# Patient Record
Sex: Male | Born: 1955 | Race: White | Hispanic: No
Health system: Southern US, Community
[De-identification: ages and names within clinical notes are randomized; demographics above are authoritative.]

---

## 1999-02-09 ENCOUNTER — Ambulatory Visit (HOSPITAL_COMMUNITY): Admission: RE | Admit: 1999-02-09 | Discharge: 1999-02-09 | Payer: Self-pay | Admitting: Gastroenterology

## 2003-12-30 ENCOUNTER — Encounter: Admission: RE | Admit: 2003-12-30 | Discharge: 2003-12-30 | Payer: Self-pay | Admitting: Sports Medicine

## 2004-01-13 ENCOUNTER — Encounter: Admission: RE | Admit: 2004-01-13 | Discharge: 2004-01-13 | Payer: Self-pay | Admitting: Sports Medicine

## 2004-02-21 ENCOUNTER — Encounter: Admission: RE | Admit: 2004-02-21 | Discharge: 2004-02-21 | Payer: Self-pay | Admitting: Family Medicine

## 2004-03-28 ENCOUNTER — Encounter: Admission: RE | Admit: 2004-03-28 | Discharge: 2004-03-28 | Payer: Self-pay | Admitting: Family Medicine

## 2004-04-06 ENCOUNTER — Encounter: Admission: RE | Admit: 2004-04-06 | Discharge: 2004-04-06 | Payer: Self-pay | Admitting: Sports Medicine

## 2017-10-29 DIAGNOSIS — D1801 Hemangioma of skin and subcutaneous tissue: Secondary | ICD-10-CM | POA: Diagnosis not present

## 2017-10-29 DIAGNOSIS — L570 Actinic keratosis: Secondary | ICD-10-CM | POA: Diagnosis not present

## 2017-10-29 DIAGNOSIS — D485 Neoplasm of uncertain behavior of skin: Secondary | ICD-10-CM | POA: Diagnosis not present

## 2017-10-29 DIAGNOSIS — L821 Other seborrheic keratosis: Secondary | ICD-10-CM | POA: Diagnosis not present

## 2017-10-29 DIAGNOSIS — D225 Melanocytic nevi of trunk: Secondary | ICD-10-CM | POA: Diagnosis not present

## 2018-01-20 DIAGNOSIS — Z13220 Encounter for screening for lipoid disorders: Secondary | ICD-10-CM | POA: Diagnosis not present

## 2018-01-20 DIAGNOSIS — Z0000 Encounter for general adult medical examination without abnormal findings: Secondary | ICD-10-CM | POA: Diagnosis not present

## 2018-01-23 DIAGNOSIS — Z0000 Encounter for general adult medical examination without abnormal findings: Secondary | ICD-10-CM | POA: Diagnosis not present

## 2018-02-02 DIAGNOSIS — R944 Abnormal results of kidney function studies: Secondary | ICD-10-CM | POA: Diagnosis not present

## 2018-02-05 DIAGNOSIS — I519 Heart disease, unspecified: Secondary | ICD-10-CM | POA: Diagnosis not present

## 2018-02-05 DIAGNOSIS — E7849 Other hyperlipidemia: Secondary | ICD-10-CM | POA: Diagnosis not present

## 2018-02-05 DIAGNOSIS — Z8249 Family history of ischemic heart disease and other diseases of the circulatory system: Secondary | ICD-10-CM | POA: Diagnosis not present

## 2018-02-06 ENCOUNTER — Other Ambulatory Visit: Payer: Self-pay | Admitting: Cardiology

## 2018-02-06 DIAGNOSIS — Z8249 Family history of ischemic heart disease and other diseases of the circulatory system: Secondary | ICD-10-CM

## 2018-02-24 ENCOUNTER — Other Ambulatory Visit: Payer: Self-pay

## 2018-02-24 ENCOUNTER — Ambulatory Visit
Admission: RE | Admit: 2018-02-24 | Discharge: 2018-02-24 | Disposition: A | Discharge status: Home / Self Care | Payer: 59 | Source: Ambulatory Visit | Attending: Cardiology | Admitting: Cardiology

## 2018-02-24 DIAGNOSIS — Z8249 Family history of ischemic heart disease and other diseases of the circulatory system: Secondary | ICD-10-CM

## 2018-03-02 ENCOUNTER — Other Ambulatory Visit: Payer: Self-pay

## 2018-10-30 DIAGNOSIS — D225 Melanocytic nevi of trunk: Secondary | ICD-10-CM | POA: Diagnosis not present

## 2018-10-30 DIAGNOSIS — L814 Other melanin hyperpigmentation: Secondary | ICD-10-CM | POA: Diagnosis not present

## 2018-10-30 DIAGNOSIS — L570 Actinic keratosis: Secondary | ICD-10-CM | POA: Diagnosis not present

## 2018-10-30 DIAGNOSIS — L859 Epidermal thickening, unspecified: Secondary | ICD-10-CM | POA: Diagnosis not present

## 2018-10-30 DIAGNOSIS — D485 Neoplasm of uncertain behavior of skin: Secondary | ICD-10-CM | POA: Diagnosis not present

## 2018-10-30 DIAGNOSIS — L821 Other seborrheic keratosis: Secondary | ICD-10-CM | POA: Diagnosis not present

## 2018-12-31 IMAGING — CT CT HEART SCORING
4 series · 12 of 20 positions shown, 13 images · non-contrast
Comparison: None.

CLINICAL DATA: 62-year-old male with history of elevated
cholesterol. Family history of heart disease.

EXAM:
CT HEART FOR CALCIUM SCORING
TECHNIQUE: CT heart was performed on a 64 channel system using prospective ECG
gating.
A non-contrast exam for calcium scoring was performed.
Note that this exam targets the heart and the chest was not imaged
in its entirety.

[Series 2: calcium scoring 2.00 qr36 bestdiast 68% · axial · 0.41mm/px · z∈[+1741,+1829]mm · 3 of 89 slices shown, 4 images]
[im 23/89  vessel]
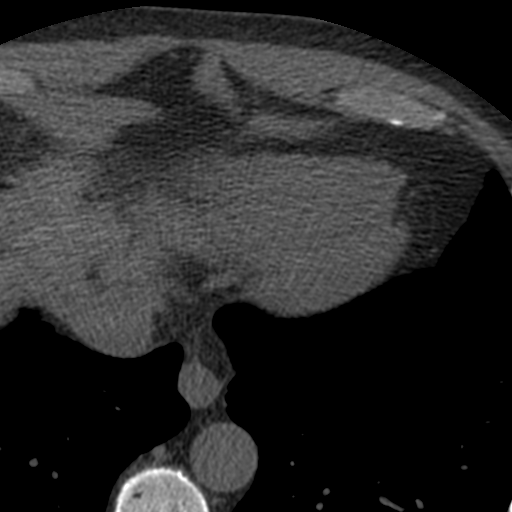
[im 23/89  lung]
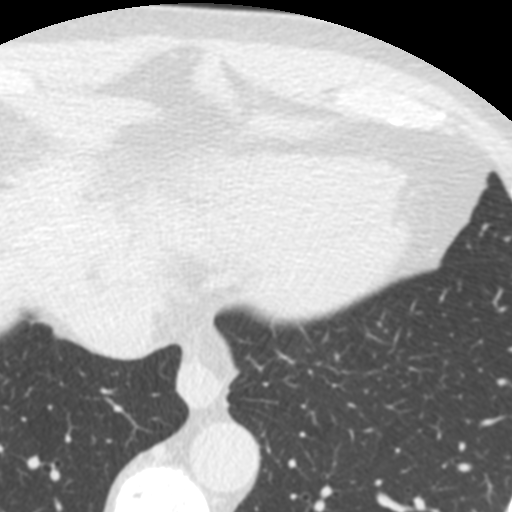
[im 45/89  vessel]
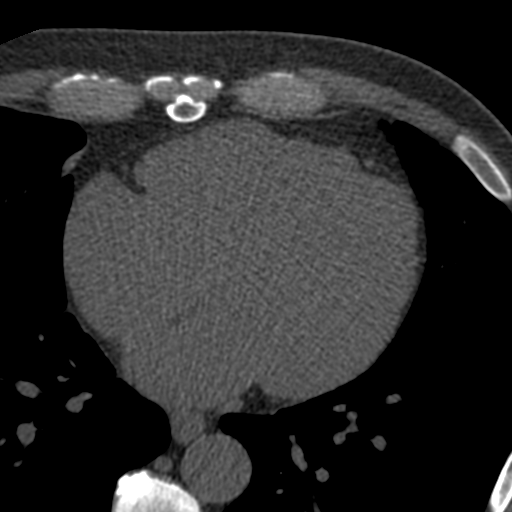
[im 67/89  vessel]
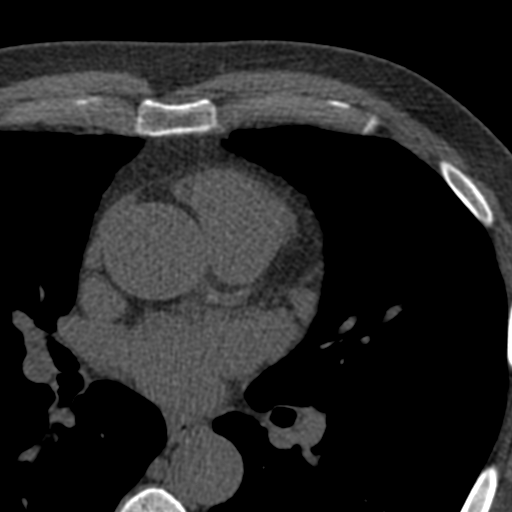

[Series 3: calcium scoring 2.00 br40 bestdiast 68% ax fov · axial · 0.41mm/px · z∈[+1741,+1829]mm · 3 of 89 slices shown (1 of 2)]
[im 23/89  vessel]
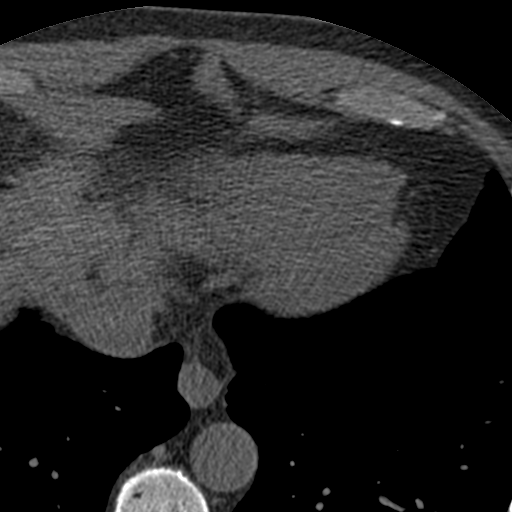
[im 45/89  vessel]
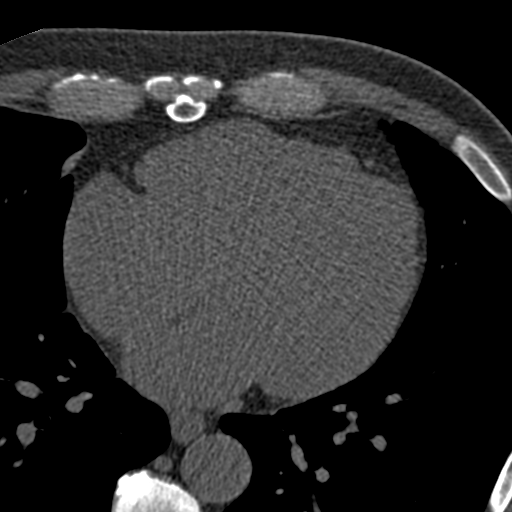
[im 67/89  vessel]
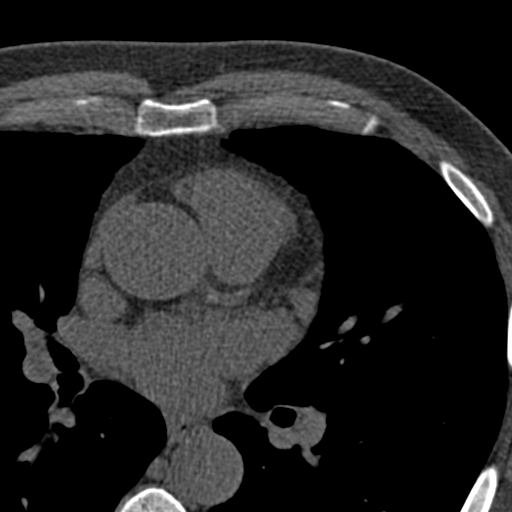

[Series 9: calcium scoring 2.00 br60 bestdiast 68% ax fov · axial · 0.65mm/px · z∈[+1739,+1827]mm · 3 of 90 slices shown]
[im 23/90  vessel]
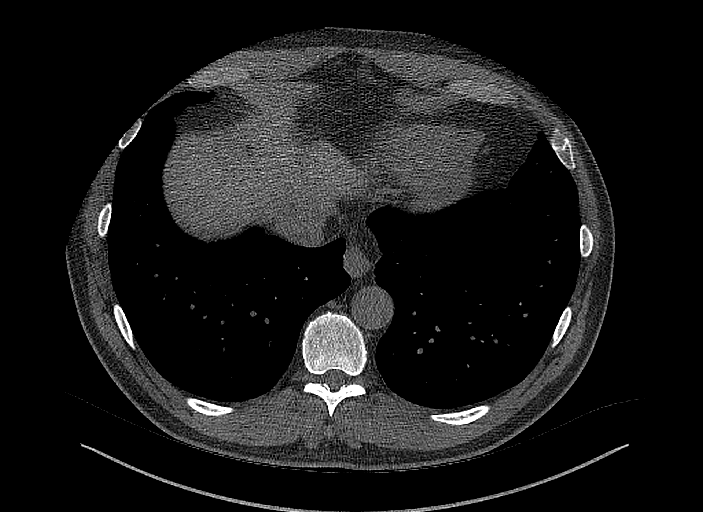
[im 45/90  vessel]
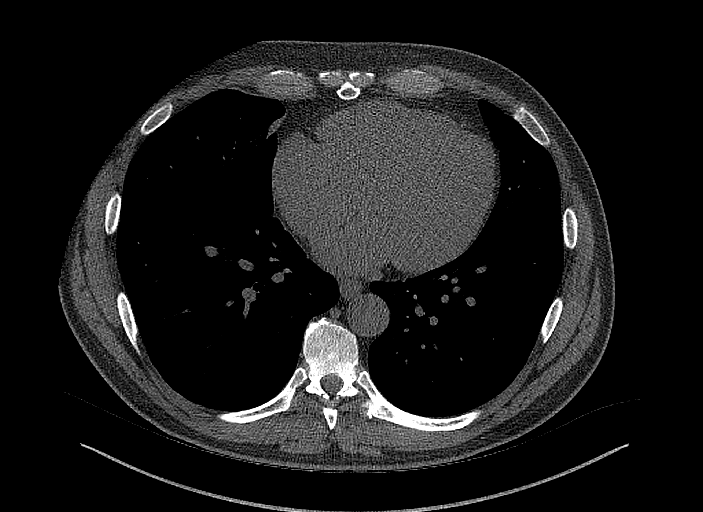
[im 67/90  vessel]
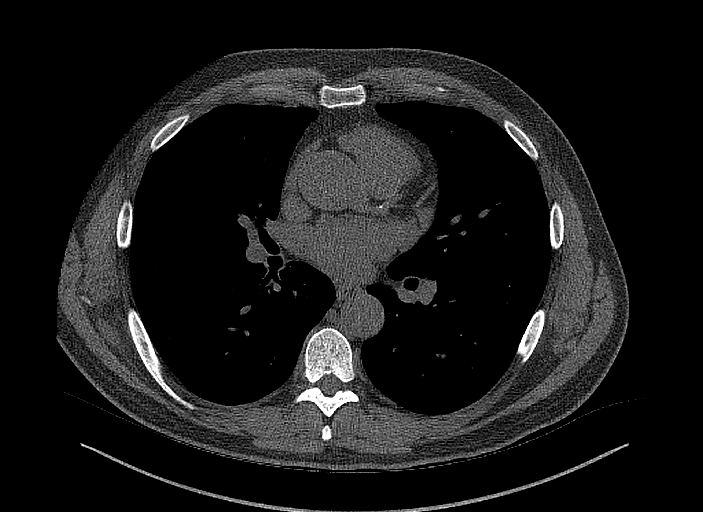

[Series 12: calcium scoring 2.00 br40 bestdiast 68% ax fov · axial · 0.63mm/px · z∈[+1741,+1829]mm · 3 of 89 slices shown (2 of 2)]
[im 23/89  vessel]
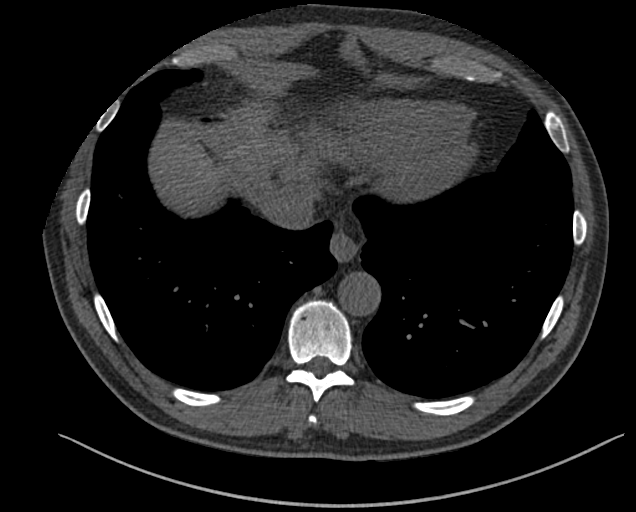
[im 45/89  vessel]
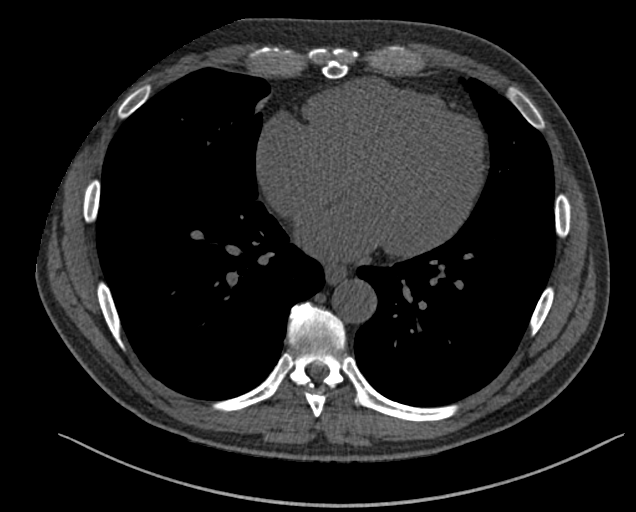
[im 67/89  vessel]
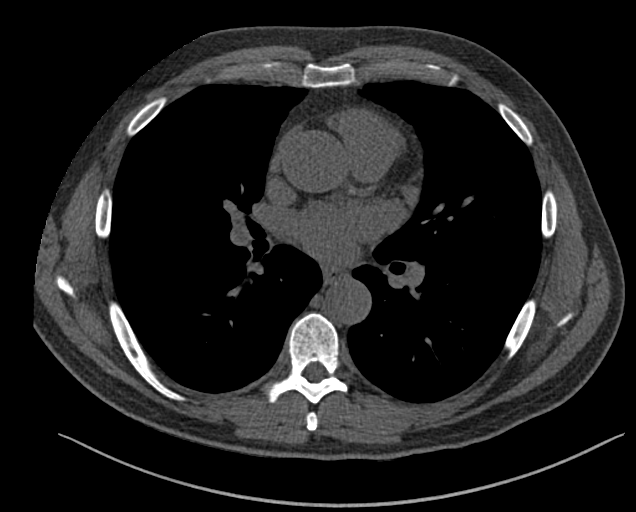

[12 of 20 positions shown; findings below may reference images not displayed]

FINDINGS: Technical quality: Good.

CORONARY CALCIUM

Total Agatston Score: 9

[HOSPITAL] percentile:  38th

OTHER FINDINGS:

2 mm right lower lobe nodule (axial image 21 of series 9). Within
the visualized portions of the thorax there are no other larger more
suspicious appearing pulmonary nodules or masses, there is no acute
consolidative airspace disease, no pleural effusions, no
pneumothorax and no lymphadenopathy. Mild scarring in the medial
segment of the right middle lobe and lingula. Visualized portions of
the upper abdomen are unremarkable. There are no aggressive
appearing lytic or blastic lesions noted in the visualized portions
of the skeleton.
IMPRESSION: 1. Patient's total coronary artery calcium score is 9 which is 38th
percentile for patient's of matched age, gender and race/ethnicity.
Please note that although the presence of coronary artery calcium
documents the presence of coronary artery disease, the severity of
this disease and any potential stenosis cannot be assessed on this
noncontrast CT examination. Assessment for potential risk factor
modification, dietary therapy or pharmacologic therapy may be
warranted, if clinically indicated.
2. 2 mm right lower lobe pulmonary nodule. This is nonspecific, but
statistically likely benign. No follow-up needed if patient is
low-risk. Non-contrast chest CT can be considered in 12 months if
patient is high-risk. This recommendation follows the consensus
statement: Guidelines for Management of Incidental Pulmonary Nodules
Detected on CT Images: From the [HOSPITAL] 6385; Radiology

## 2019-02-23 DIAGNOSIS — R944 Abnormal results of kidney function studies: Secondary | ICD-10-CM | POA: Diagnosis not present

## 2019-02-23 DIAGNOSIS — Z0000 Encounter for general adult medical examination without abnormal findings: Secondary | ICD-10-CM | POA: Diagnosis not present

## 2019-02-24 DIAGNOSIS — Z0000 Encounter for general adult medical examination without abnormal findings: Secondary | ICD-10-CM | POA: Diagnosis not present

## 2019-11-05 ENCOUNTER — Other Ambulatory Visit: Payer: Self-pay

## 2019-11-05 ENCOUNTER — Ambulatory Visit (INDEPENDENT_AMBULATORY_CARE_PROVIDER_SITE_OTHER): Payer: 59 | Admitting: Family Medicine

## 2019-11-05 ENCOUNTER — Encounter: Payer: Self-pay | Admitting: Family Medicine

## 2019-11-05 DIAGNOSIS — M25562 Pain in left knee: Secondary | ICD-10-CM

## 2019-11-05 DIAGNOSIS — M545 Low back pain, unspecified: Secondary | ICD-10-CM

## 2019-11-05 MED ORDER — TRAMADOL HCL 50 MG PO TABS: 50.0000 mg | ORAL_TABLET | Freq: Four times a day (QID) | ORAL | 0 refills | Status: AC | PRN

## 2019-11-05 MED ORDER — PREDNISONE 10 MG PO TABS: ORAL_TABLET | ORAL | 0 refills | Status: AC

## 2019-11-05 MED ORDER — TIZANIDINE HCL 2 MG PO TABS: 2.0000 mg | ORAL_TABLET | Freq: Four times a day (QID) | ORAL | 1 refills | Status: DC | PRN

## 2019-11-05 NOTE — Patient Instructions (Signed)
   Try McKenzie Protocol.  Will order x-rays and MRI if fails to improve.

## 2019-11-05 NOTE — Progress Notes (Signed)
   Office Visit Note   Patient: Alexander Brown           Date of Birth: [birth year]           MRN: [id number] Visit Date: 11/05/2019 Requested by: Lawerance Cruel, [place],  [place] [id number] PCP: Lawerance Cruel, MD  Subjective: Chief Complaint  Patient presents with  . Left Knee - Pain    "Flash-like" pain anterior knee x 2-3 days.  . Lower Back - Pain    Left lower back pain x 2-3 days. NKI. Difficulty sleeping due to pain.     HPI: He is here with left-sided low back and leg pain.  Symptoms started about 2 or 3 weeks ago, no injury.  He started noticing pain in the left posterior hip as well as around the knee.  Pain became intense, he cannot find a comfortable position and he cannot sleep at night.  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  He is a runner and had to stop running because of this current pain.  There has been no change in his running activities leading up to this.  Denies bowel or bladder dysfunction, fevers or chills.              ROS: No rash noted.  He has hyperlipidemia for which he takes Crestor for the past 2 years.  All other systems were reviewed and are negative.  Objective: Vital Signs: There were no vitals taken for this visit.  Physical Exam:  General:  Alert and oriented, in no acute distress. Pulm:  Breathing unlabored. Psy:  Normal mood, congruent affect. Skin: No visible rash. Low back: He is nontender along the spinous processes or SI joints.  He does have a little bit of pain in the left sciatic notch.  Straight leg raise is positive on the left, negative on the right.  Stork test is negative bilaterally.  He has slight weakness with ankle dorsiflexion on the left, 4-/5.  Eversion is slightly weak as well.  Remainder of lower extremity strength is normal.   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  Imaging: None today.  Assessment & Plan: 1.  Left-sided low back pain with ankle dorsiflexion weakness, suspicious for recurrent disc protrusion. -Prednisone taper, Zanaflex and tramadol as needed. -Back extension exercises at home.  Physical therapy referral given.  Could try chiropractic per Dr. Owens Shark as well. -If he is not improving in the next few weeks, he will contact me and we will proceed with x-rays and MRI scan followed by epidural injection if indicated.     Procedures: No procedures performed  No notes on file     PMFS History: There are no active problems to display for this patient.  History reviewed. No pertinent past medical history.  History reviewed. No pertinent family history.  History reviewed. No pertinent surgical history. Social History   Occupational History  . Not on file  Tobacco Use  . Smoking status: Not on file  Substance and Sexual Activity  . Alcohol use: Not on file  . Drug use: Not on file  . Sexual activity: Not on file

## 2019-12-30 ENCOUNTER — Other Ambulatory Visit: Payer: Self-pay | Admitting: Family Medicine
